# Patient Record
Sex: Male | Born: 2000 | Race: Black or African American | Hispanic: Yes | Marital: Single | Smoking: Never smoker
Health system: Southern US, Community
[De-identification: ages and names within clinical notes are randomized; demographics above are authoritative.]

## PROBLEM LIST (undated history)

## (undated) DIAGNOSIS — Z789 Other specified health status: Secondary | ICD-10-CM

## (undated) HISTORY — PX: NO PAST SURGERIES: SHX2092

## (undated) HISTORY — DX: Other specified health status: Z78.9

---
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
## 2022-03-17 ENCOUNTER — Telehealth: Payer: Self-pay | Admitting: Family

## 2022-03-17 NOTE — Telephone Encounter (Signed)
Returning pt's call.   Appt needed. We have Wed 30th of August w/ PCP Amy Zonia Kief in the morning if this suits the pt. LVM to call back to schedule appt w/ his PCP for follow up & labwork.

## 2022-03-29 ENCOUNTER — Ambulatory Visit (INDEPENDENT_AMBULATORY_CARE_PROVIDER_SITE_OTHER): Payer: No Typology Code available for payment source | Admitting: Podiatry

## 2022-03-29 DIAGNOSIS — B351 Tinea unguium: Secondary | ICD-10-CM

## 2022-03-29 MED ORDER — TERBINAFINE HCL 250 MG PO TABS: 250.0000 mg | ORAL_TABLET | Freq: Every day | ORAL | 0 refills | Status: AC

## 2022-03-29 NOTE — Progress Notes (Signed)
Subjective:   Patient ID: Gavin Odom, male   DOB: 21 y.o.   MRN: [id number]   HPI Patient presents with caregiver with significant yellow nail disease 1-5 both feet that have been thick with family history of this with father having same condition and positive fungal cultures.  Patient does not smoke likes to be active   Review of Systems  All other systems reviewed and are negative.       Objective:  Physical Exam Vitals and nursing note reviewed.  Constitutional:      Appearance: He is well-developed.  Pulmonary:     Effort: Pulmonary effort is normal.  Musculoskeletal:        General: Normal range of motion.  Skin:    General: Skin is warm.  Neurological:     Mental Status: He is alert.     Neurovascular status intact muscle strength adequate range of motion within normal limits with patient noted to have thick yellow brittle nailbeds 1-5 both feet that do get aggravated and are hard for him to cut.  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  I did explain this in procedure patient wants to have this done and I did place on Lamisil 250 mg for 90 days and I also gave instructions for liver function studies and ordered them today.  Patient will be seen back encouraged to call with questions concerns which may arise

## 2022-03-30 LAB — HEPATIC FUNCTION PANEL
AG Ratio: 2 (calc) (ref 1.0–2.5)
ALT: 21 U/L (ref 9–46)
AST: 25 U/L (ref 10–40)
Albumin: 4.6 g/dL (ref 3.6–5.1)
Alkaline phosphatase (APISO): 68 U/L (ref 36–130)
Bilirubin, Direct: 0.2 mg/dL (ref 0.0–0.2)
Globulin: 2.3 g/dL (calc) (ref 1.9–3.7)
Indirect Bilirubin: 0.8 mg/dL (calc) (ref 0.2–1.2)
Total Bilirubin: 1 mg/dL (ref 0.2–1.2)
Total Protein: 6.9 g/dL (ref 6.1–8.1)

## 2024-01-09 ENCOUNTER — Encounter: Payer: Self-pay | Admitting: Family

## 2024-01-16 NOTE — Telephone Encounter (Signed)
 Pt requesting printout of immunization records; missing pg 1. Please print out for pt pick up

## 2024-01-17 NOTE — Telephone Encounter (Signed)
 Error paperwork has not be picked up

## 2024-01-17 NOTE — Telephone Encounter (Signed)
Paperwork picked up by patient.

## 2024-01-31 ENCOUNTER — Encounter: Payer: Self-pay | Admitting: Family

## 2024-02-13 ENCOUNTER — Ambulatory Visit (INDEPENDENT_AMBULATORY_CARE_PROVIDER_SITE_OTHER): Payer: Self-pay | Admitting: Podiatry

## 2024-02-13 ENCOUNTER — Encounter: Payer: Self-pay | Admitting: Podiatry

## 2024-02-13 VITALS — BP 128/74 | HR 75 | Temp 98.0°F | Resp 20 | Ht 70.98 in | Wt 171.0 lb

## 2024-02-13 DIAGNOSIS — B351 Tinea unguium: Secondary | ICD-10-CM | POA: Diagnosis not present

## 2024-02-13 MED ORDER — TERBINAFINE HCL 250 MG PO TABS: 250.0000 mg | ORAL_TABLET | Freq: Every day | ORAL | 0 refills | Status: DC

## 2024-02-14 NOTE — Progress Notes (Signed)
 Subjective:   Patient ID: Gavin Odom, male   DOB: 23 y.o.   MRN: [id number]   HPI Patient states that he is still having trouble with his nails and admits that he did not take the medicine that we placed him on several years ago.  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  Patient understands it may take upwards of 6 months to make a big difference and he will be patient and I did did courtesy debridement of the nails today

## 2024-03-20 ENCOUNTER — Ambulatory Visit: Payer: Self-pay | Admitting: Family Medicine

## 2024-05-15 ENCOUNTER — Other Ambulatory Visit: Payer: Self-pay | Admitting: Podiatry

## 2024-05-16 NOTE — Telephone Encounter (Signed)
 This is maximum we use. Will continue to work

## 2024-07-11 ENCOUNTER — Ambulatory Visit
Admission: RE | Admit: 2024-07-11 | Discharge: 2024-07-11 | Disposition: A | Discharge status: Home / Self Care | Attending: Family Medicine

## 2024-07-11 VITALS — BP 136/84 | HR 60 | Temp 98.2°F | Resp 16

## 2024-07-11 DIAGNOSIS — N50812 Left testicular pain: Secondary | ICD-10-CM | POA: Diagnosis not present

## 2024-07-11 LAB — POCT URINE DIPSTICK
Bilirubin, UA: NEGATIVE
Blood, UA: NEGATIVE
Glucose, UA: NEGATIVE mg/dL
Ketones, POC UA: NEGATIVE mg/dL
Leukocytes, UA: NEGATIVE
Nitrite, UA: NEGATIVE
POC PROTEIN,UA: NEGATIVE
Spec Grav, UA: 1.015 (ref 1.010–1.025)
Urobilinogen, UA: 0.2 U/dL
pH, UA: 7 (ref 5.0–8.0)

## 2024-07-11 NOTE — ED Provider Notes (Signed)
 [facility]    CSN: [id number] Arrival date & time: 07/11/24  1207      History   Chief Complaint Chief Complaint  Patient presents with   Testicle Pain    Pain in right testicle for over a month. Pain comes and goes, at times it feels numb, especially when sitting for a long period of time. - Entered by patient    HPI Oron Westrup is a 23 y.o. male.   Patient presenting today with 1 month history of intermittent episodic left testicle pain.  Denies notice of scrotal edema, discoloration, pelvic or abdominal pain, dysuria, hematuria, penile discharge, rashes or lesions, injury to the area.  So far not tried anything over-the-counter for symptoms.  States he gets routine STD screening and only has 1 partner, not concern for STDs.    Past Medical History:  Diagnosis Date   No pertinent past medical history     There are no active problems to display for this patient.   Past Surgical History:  Procedure Laterality Date   NO PAST SURGERIES         Home Medications    Prior to Admission medications  Medication Sig Start Date End Date Taking? Authorizing Provider  ketoconazole  (NIZORAL ) 2 % cream Apply 1 application topically daily. 09/23/20   Jaycee Greig PARAS, NP  pantoprazole  (PROTONIX ) 40 MG tablet Take 1 tablet (40 mg total) by mouth in the morning. 09/08/20   Eda Iha, MD  terbinafine  (LAMISIL ) 250 MG tablet Take 1 tablet (250 mg total) by mouth daily. 03/29/22   Magdalen Pasco RAMAN, DPM  terbinafine  (LAMISIL ) 250 MG tablet TAKE 1 TABLET BY MOUTH EVERY DAY 05/22/24   Regal, Pasco RAMAN, DPM    Family History Family History  Problem Relation Age of Onset   Healthy Mother    Stomach cancer Paternal Aunt     Social History Social History[1]   Allergies   Patient has no known allergies.   Review of Systems Review of Systems Per HPI  Physical Exam Triage Vital Signs ED Triage Vitals  Encounter Vitals Group     BP 07/11/24 1216  136/84     Girls Systolic BP Percentile --      Girls Diastolic BP Percentile --      Boys Systolic BP Percentile --      Boys Diastolic BP Percentile --      Pulse Rate 07/11/24 1216 60     Resp 07/11/24 1216 16     Temp 07/11/24 1216 98.2 F (36.8 C)     Temp Source 07/11/24 1216 Oral     SpO2 07/11/24 1216 98 %     Weight --      Height --      Head Circumference --      Peak Flow --      Pain Score 07/11/24 1215 6     Pain Loc --      Pain Education --      Exclude from Growth Chart --    No data found.  Updated Vital Signs BP 136/84 (BP Location: Right Arm)   Pulse 60   Temp 98.2 F (36.8 C) (Oral)   Resp 16   SpO2 98%   Visual Acuity Right Eye Distance:   Left Eye Distance:   Bilateral Distance:    Right Eye Near:   Left Eye Near:    Bilateral Near:     Physical Exam Vitals and nursing note reviewed. Exam conducted  with a chaperone present.  Constitutional:      Appearance: Normal appearance.  HENT:     Head: Atraumatic.  Eyes:     Extraocular Movements: Extraocular movements intact.     Conjunctiva/sclera: Conjunctivae normal.  Cardiovascular:     Rate and Rhythm: Normal rate.  Pulmonary:     Effort: Pulmonary effort is normal.  Genitourinary:    Comments: Scrotum and left testicle benign appearing, nontender to palpation with no masses appreciable.  Negative cremaster, no inguinal masses or tenderness to palpation Musculoskeletal:        General: Normal range of motion.     Cervical back: Normal range of motion and neck supple.  Skin:    General: Skin is warm and dry.  Neurological:     Mental Status: He is oriented to person, place, and time.  Psychiatric:        Mood and Affect: Mood normal.        Thought Content: Thought content normal.        Judgment: Judgment normal.      UC Treatments / Results  Labs (all labs ordered are listed, but only abnormal results are displayed) Labs Reviewed  POCT URINE DIPSTICK     EKG   Radiology No results found.  Procedures Procedures (including critical care time)  Medications Ordered in UC Medications - No data to display  Initial Impression / Assessment and Plan / UC Course  I have reviewed the triage vital signs and the nursing notes.  Pertinent labs & imaging results that were available during my care of the patient were reviewed by me and considered in my medical decision making (see chart for details).     Vital signs and exam very reassuring today, urinalysis without evidence of abnormalities.  Urology resources given for further evaluation.  Declines STI screening.  Discussed warm compresses, over-the-counter pain relievers, supportive home care.  ED for severely worsening symptoms.  Final Clinical Impressions(s) / UC Diagnoses   Final diagnoses:  Left testicular pain     Discharge Instructions      Your vital signs and exam were very reassuring today.  Your urine testing came back completely normal.  You may apply warm compresses to the area, ibuprofen and Tylenol as needed for pain.  Follow-up with urology if not resolving.    ED Prescriptions   None    PDMP not reviewed this encounter.    [1]  Social History Tobacco Use   Smoking status: Never   Smokeless tobacco: Never  Vaping Use   Vaping status: Never Used  Substance Use Topics   Alcohol use: Never   Drug use: Never     Stuart Vernell Norris, PA-C 07/11/24 1732

## 2024-07-11 NOTE — Discharge Instructions (Addendum)
 Your vital signs and exam were very reassuring today.  Your urine testing came back completely normal.  You may apply warm compresses to the area, ibuprofen and Tylenol as needed for pain.  Follow-up with urology if not resolving.

## 2024-07-11 NOTE — ED Triage Notes (Signed)
 Pt reports he has left testicle pain that is tight and numb at times x 1 month.

## 2024-07-28 NOTE — Progress Notes (Unsigned)
 "   CC: Lt testicle hurts   HPI: 23 year old male coming in with left testicular pain.  Has been going on for 1 to 2 months.  Went to the emergency room on the 12th of this month.  Normal exam.  He does have significant caffeine intake-4 to 5 cups of coffee a day.  Some urinary frequency and feeling of incomplete emptying.  No gross hematuria, no dysuria.  No right-sided symptoms.  Pain is worse with sitting.  He states that the pain is also worse when he is mentally involved.  No pain with lying down.   PMH: Past Medical History:  Diagnosis Date   No pertinent past medical history     Surgical History: Past Surgical History:  Procedure Laterality Date   NO PAST SURGERIES      Home Medications:  Allergies as of 07/29/2024   No Known Allergies      Medication List        Accurate as of July 28, 2024  7:45 PM. If you have any questions, ask your nurse or doctor.          ketoconazole  2 % cream Commonly known as: NIZORAL  Apply 1 application topically daily.   pantoprazole  40 MG tablet Commonly known as: PROTONIX  Take 1 tablet (40 mg total) by mouth in the morning.   terbinafine  250 MG tablet Commonly known as: LamISIL  Take 1 tablet (250 mg total) by mouth daily.   terbinafine  250 MG tablet Commonly known as: LAMISIL  TAKE 1 TABLET BY MOUTH EVERY DAY        Allergies: Allergies[1]  Family History: Family History  Problem Relation Age of Onset   Healthy Mother    Stomach cancer Paternal Aunt     Social History:  reports that he has never smoked. He has never used smokeless tobacco. He reports that he does not drink alcohol and does not use drugs.  ROS: All other review of systems were reviewed and are negative except what is noted above in HPI  Physical Exam: There were no vitals taken for this visit.  Constitutional:  Alert and oriented, No acute distress. HEENT: [place] AT, moist mucus membranes.  Trachea midline, no masses. Cardiovascular: No  clubbing, cyanosis, or edema. Respiratory: Normal respiratory effort, no increased work of breathing. GI: No inguinal hernias GU: Phallus is not circumcised.  Meatus normal.  No urethral discharge.  Scrotal skin normal.  Testicle, cords, epididymal structures normal bilaterally.  There are no inguinal hernias. Lymph: No cervical or inguinal lymphadenopathy. Skin: No rashes, bruises or suspicious lesions. Neurologic: Grossly intact, no focal deficits, moving all 4 extremities. Psychiatric: Normal mood and affect.  Laboratory Data: Lab Results  Component Value Date   WBC 3.9 09/22/2020   HGB 14.7 09/22/2020   HCT 44.0 09/22/2020   MCV 93 09/22/2020   PLT 280 09/22/2020    Lab Results  Component Value Date   CREATININE 0.95 09/22/2020    No results found for: PSA  No results found for: TESTOSTERONE  Lab Results  Component Value Date   HGBA1C 5.3 09/22/2020    Urinalysis Clear-done on December 12    Assessment:  Left sided testicular pain.  Totally normal scrotal exam.  No evident hernia.  May well be from pelvic floor syndrome.  Plan:   I spent a long time discussing causative factors of testicular pain with the patient and his significant other  I reassured him that his testicular exam is normal  I recommended he read  up on pelvic floor syndrome-limit caffeine, hot tub baths  I do not think he needs any imaging  Return as needed       [1] No Known Allergies  "

## 2024-07-29 ENCOUNTER — Ambulatory Visit: Admitting: Urology

## 2024-07-29 VITALS — BP 112/64 | HR 74

## 2024-07-29 DIAGNOSIS — N50812 Left testicular pain: Secondary | ICD-10-CM

## 2024-07-29 LAB — URINALYSIS, ROUTINE W REFLEX MICROSCOPIC
Bilirubin, UA: NEGATIVE
Glucose, UA: NEGATIVE
Ketones, UA: NEGATIVE
Leukocytes,UA: NEGATIVE
Nitrite, UA: NEGATIVE
Protein,UA: NEGATIVE
RBC, UA: NEGATIVE
Specific Gravity, UA: 1.02 (ref 1.005–1.030)
Urobilinogen, Ur: 0.2 mg/dL (ref 0.2–1.0)
pH, UA: 6 (ref 5.0–7.5)

## 2024-08-13 ENCOUNTER — Encounter: Admitting: Family

## 2024-08-13 NOTE — Progress Notes (Signed)
 Erroneous encounter-disregard
